# Patient Record
Sex: Male | Born: 1960 | Race: White | Hispanic: No | Smoking: Former smoker
Health system: Southern US, Community
[De-identification: ages and names within clinical notes are randomized; demographics above are authoritative.]

## PROBLEM LIST (undated history)

## (undated) DIAGNOSIS — I10 Essential (primary) hypertension: Secondary | ICD-10-CM

## (undated) DIAGNOSIS — K219 Gastro-esophageal reflux disease without esophagitis: Secondary | ICD-10-CM

---

## 2014-07-25 ENCOUNTER — Encounter (HOSPITAL_COMMUNITY): Payer: Self-pay | Admitting: Family Medicine

## 2014-07-25 ENCOUNTER — Emergency Department (HOSPITAL_COMMUNITY): Payer: Self-pay

## 2014-07-25 ENCOUNTER — Emergency Department (HOSPITAL_COMMUNITY)
Admission: EM | Admit: 2014-07-25 | Discharge: 2014-07-25 | Disposition: A | Discharge status: Home / Self Care | Payer: Self-pay | Attending: Emergency Medicine | Admitting: Emergency Medicine

## 2014-07-25 DIAGNOSIS — Z87891 Personal history of nicotine dependence: Secondary | ICD-10-CM | POA: Insufficient documentation

## 2014-07-25 DIAGNOSIS — Z7982 Long term (current) use of aspirin: Secondary | ICD-10-CM | POA: Insufficient documentation

## 2014-07-25 DIAGNOSIS — Z79899 Other long term (current) drug therapy: Secondary | ICD-10-CM | POA: Insufficient documentation

## 2014-07-25 DIAGNOSIS — M542 Cervicalgia: Secondary | ICD-10-CM | POA: Insufficient documentation

## 2014-07-25 DIAGNOSIS — R0600 Dyspnea, unspecified: Secondary | ICD-10-CM | POA: Insufficient documentation

## 2014-07-25 DIAGNOSIS — I10 Essential (primary) hypertension: Secondary | ICD-10-CM | POA: Insufficient documentation

## 2014-07-25 DIAGNOSIS — R0789 Other chest pain: Secondary | ICD-10-CM | POA: Insufficient documentation

## 2014-07-25 DIAGNOSIS — K219 Gastro-esophageal reflux disease without esophagitis: Secondary | ICD-10-CM | POA: Insufficient documentation

## 2014-07-25 DIAGNOSIS — F419 Anxiety disorder, unspecified: Secondary | ICD-10-CM | POA: Insufficient documentation

## 2014-07-25 HISTORY — DX: Gastro-esophageal reflux disease without esophagitis: K21.9

## 2014-07-25 HISTORY — DX: Essential (primary) hypertension: I10

## 2014-07-25 LAB — COMPREHENSIVE METABOLIC PANEL
ALBUMIN: 4.2 g/dL (ref 3.5–5.2)
ALT: 21 U/L (ref 0–53)
ANION GAP: 8 (ref 5–15)
AST: 28 U/L (ref 0–37)
Alkaline Phosphatase: 64 U/L (ref 39–117)
BUN: 12 mg/dL (ref 6–23)
CHLORIDE: 107 mmol/L (ref 96–112)
CO2: 23 mmol/L (ref 19–32)
Calcium: 9.3 mg/dL (ref 8.4–10.5)
Creatinine, Ser: 0.86 mg/dL (ref 0.50–1.35)
GFR calc Af Amer: >90 mL/min (ref >90)
GFR calc non Af Amer: >90 mL/min (ref >90)
GLUCOSE: 113 mg/dL — AB (ref 70–99)
Potassium: 4.3 mmol/L (ref 3.5–5.1)
Sodium: 138 mmol/L (ref 135–145)
Total Bilirubin: 0.8 mg/dL (ref 0.3–1.2)
Total Protein: 6.3 g/dL (ref 6.0–8.3)

## 2014-07-25 LAB — CBC
HCT: 42 % (ref 39.0–52.0)
HEMOGLOBIN: 14.2 g/dL (ref 13.0–17.0)
MCH: 27.7 pg (ref 26.0–34.0)
MCHC: 33.8 g/dL (ref 30.0–36.0)
MCV: 81.9 fL (ref 78.0–100.0)
Platelets: 182 10*3/uL (ref 150–400)
RBC: 5.13 MIL/uL (ref 4.22–5.81)
RDW: 13.1 % (ref 11.5–15.5)
WBC: 5.6 10*3/uL (ref 4.0–10.5)

## 2014-07-25 LAB — TROPONIN I: Troponin I: <0.03 ng/mL (ref <0.031)

## 2014-07-25 LAB — D-DIMER, QUANTITATIVE: D-Dimer, Quant: <0.27 ug/mL-FEU (ref 0.00–0.48)

## 2014-07-25 MED ORDER — TRAMADOL HCL 50 MG PO TABS: 50.0000 mg | ORAL_TABLET | Freq: Four times a day (QID) | ORAL | Status: AC | PRN

## 2014-07-25 MED ORDER — SODIUM CHLORIDE 0.9 % IV BOLUS (SEPSIS)
1000.0000 mL | Freq: Once | INTRAVENOUS | Status: AC
  Administered 2014-07-25: 1000 mL via INTRAVENOUS

## 2014-07-25 MED ORDER — IBUPROFEN 800 MG PO TABS
800.0000 mg | ORAL_TABLET | Freq: Three times a day (TID) | ORAL | Status: AC
Start: 2014-07-25 — End: 2014-07-29

## 2014-07-25 MED ORDER — HYDROMORPHONE HCL 1 MG/ML IJ SOLN
1.0000 mg | Freq: Once | INTRAMUSCULAR | Status: AC
  Administered 2014-07-25: 1 mg via INTRAVENOUS
  Filled 2014-07-25: qty 1

## 2014-07-25 NOTE — ED Notes (Signed)
Pt ambulatory to the bathroom at this time stated "I really have to go"

## 2014-07-25 NOTE — ED Provider Notes (Signed)
CSN: [id number]     Arrival date & time 07/25/14  4098 History   First MD Initiated Contact with Patient 07/25/14 [phone]     Chief Complaint  Patient presents with  . Chest Pain  . Shortness of Breath     (Consider location/radiation/quality/duration/timing/severity/associated sxs/prior Treatment) HPI patient presents with worsening right-sided chest pain and dyspnea.  Symptoms began about 3 weeks ago, have progressed. No clear precipitant. Since onset no clear alleviating or exacerbating factors, and the patient denies pleuritic pain. Patient was in his usual state of health prior to the onset of pain. Pain is right sided, sore, nonradiating. There is mild associated posterior neck pain on the left. No syncope, nausea, vomiting, fever, chills, cough.   Past Medical History  Diagnosis Date  . Hypertension   . GERD (gastroesophageal reflux disease)    History reviewed. No pertinent past surgical history. No family history on file. History  Substance Use Topics  . Smoking status: Former Games developer  . Smokeless tobacco: Not on file  . Alcohol Use: Not on file    Review of Systems  Constitutional:       Per HPI, otherwise negative  HENT:       Per HPI, otherwise negative  Respiratory:       Per HPI, otherwise negative  Cardiovascular:       Per HPI, otherwise negative  Gastrointestinal: Negative for vomiting.  Endocrine:       Negative aside from HPI  Genitourinary:       Neg aside from HPI   Musculoskeletal:       Per HPI, otherwise negative  Skin: Negative.   Neurological: Negative for syncope.      Allergies  Review of patient's allergies indicates no known allergies.  Home Medications   Prior to Admission medications   Medication Sig Start Date End Date Taking? Authorizing Provider  AMLODIPINE BESYLATE PO Take 1 tablet by mouth daily.   Yes Historical Provider, MD  aspirin EC 81 MG tablet Take 81 mg by mouth daily.   Yes Historical Provider, MD  ibuprofen  (ADVIL,MOTRIN) 800 MG tablet Take 800 mg by mouth every 8 (eight) hours as needed.   Yes Historical Provider, MD  LISINOPRIL PO Take 1 tablet by mouth daily.   Yes Historical Provider, MD  omeprazole (PRILOSEC) 20 MG capsule Take 20 mg by mouth daily.   Yes Historical Provider, MD   BP 134/78 mmHg  Pulse 65  Temp(Src) 97.7 F (36.5 C) (Oral)  Resp 18  SpO2 100% Physical Exam  Constitutional: He is oriented to person, place, and time. He appears well-developed. No distress.  HENT:  Head: Normocephalic and atraumatic.  Eyes: Conjunctivae and EOM are normal.  Cardiovascular: Normal rate and regular rhythm.   Pulmonary/Chest: Effort normal. No stridor. No respiratory distress.  Abdominal: He exhibits no distension.  Musculoskeletal: He exhibits no edema.  Neurological: He is alert and oriented to person, place, and time.  Skin: Skin is warm and dry.  Psychiatric: His mood appears anxious.  Nursing note and vitals reviewed.   ED Course  Procedures (including critical care time) Labs Review Labs Reviewed  COMPREHENSIVE METABOLIC PANEL - Abnormal; Notable for the following:    Glucose, Bld 113 (*)    All other components within normal limits  M. TUBERCULOSIS COMPLEX BY PCR  CBC  TROPONIN I  D-DIMER, QUANTITATIVE    Imaging Review Dg Chest 2 View  07/25/2014   CLINICAL DATA:  Chest pain for 3 weeks  EXAM: CHEST  2 VIEW  COMPARISON:  None.  FINDINGS: Cardiomediastinal silhouette is unremarkable. Tiny calcified granuloma is noted in right upper lobe. No acute infiltrate or pleural effusion. No pulmonary edema. Bony thorax is unremarkable.  IMPRESSION: No active cardiopulmonary disease.   Electronically Signed   By: Natasha MeadLiviu  Pop M.D.   On: 07/25/2014 11:15   I reviewed the x-ray myself, agree with the interpretation.   EKG Interpretation   Date/Time:  Tuesday July 25 2014 09:09:04 EST Ventricular Rate:  63 PR Interval:  132 QRS Duration: 88 QT Interval:  379 QTC Calculation:  388 R Axis:   69 Text Interpretation:  Sinus rhythm Sinus rhythm Artifact Early  repolarization pattern though with substantial artefact Abnormal ekg  Confirmed by Gerhard MunchLOCKWOOD, Faisal Stradling  MD ([id number]) on 07/25/2014 9:43:02 AM     Cardiac 60 sinus rhythm normal Pulse ox 97% room air normal  11:53 AM Patient in no distress.  I discussed all findings with him. He now states that he left jail only 2 weeks ago.  Given the patient's risk factor of incarceration, the small granuloma visualized on x-ray, PCR TB test will be sent. Positive test result will be conveyed to him by our flow manager.  MDM  Patient presents with ongoing chest pain, mild dyspnea. Patient is awake, alert, in no distress, but continues to complain of mild ongoing right-sided chest pain. Given the duration of pain, the unremarkable EKG, negative troponin are reassuring for the low suspicion of ongoing ACS. Patient has negative d-dimer, vital signs and consistent with PE. Patient's pain is likely pleuritic, and he will be started on anti-inflammatories. Given his risk factor for TB, PCR test was sent, though the patient has no clinical demonstration of active infection.   Gerhard Munchobert Patryck Kilgore, MD 07/25/14 1155

## 2014-07-25 NOTE — Discharge Instructions (Signed)
As discussed, although today's evaluation has been largely reassuring, it is very important to follow-up with primary care physician to ensure appropriate resolution of your symptoms.  In addition, please be aware that one laboratory test is still in the process.  If this test is positive, he will be informed of the appropriate follow-up strategy.  Return here for concerning changes in your condition.  Chest Pain (Nonspecific) It is often hard to give a specific diagnosis for the cause of chest pain. There is always a chance that your pain could be related to something serious, such as a heart attack or a blood clot in the lungs. You need to follow up with your health care provider for further evaluation. CAUSES   Heartburn.  Pneumonia or bronchitis.  Anxiety or stress.  Inflammation around your heart (pericarditis) or lung (pleuritis or pleurisy).  A blood clot in the lung.  A collapsed lung (pneumothorax). It can develop suddenly on its own (spontaneous pneumothorax) or from trauma to the chest.  Shingles infection (herpes zoster virus). The chest wall is composed of bones, muscles, and cartilage. Any of these can be the source of the pain.  The bones can be bruised by injury.  The muscles or cartilage can be strained by coughing or overwork.  The cartilage can be affected by inflammation and become sore (costochondritis). DIAGNOSIS  Lab tests or other studies may be needed to find the cause of your pain. Your health care provider may have you take a test called an ambulatory electrocardiogram (ECG). An ECG records your heartbeat patterns over a 24-hour period. You may also have other tests, such as:  Transthoracic echocardiogram (TTE). During echocardiography, sound waves are used to evaluate how blood flows through your heart.  Transesophageal echocardiogram (TEE).  Cardiac monitoring. This allows your health care provider to monitor your heart rate and rhythm in real  time.  Holter monitor. This is a portable device that records your heartbeat and can help diagnose heart arrhythmias. It allows your health care provider to track your heart activity for several days, if needed.  Stress tests by exercise or by giving medicine that makes the heart beat faster. TREATMENT   Treatment depends on what may be causing your chest pain. Treatment may include:  Acid blockers for heartburn.  Anti-inflammatory medicine.  Pain medicine for inflammatory conditions.  Antibiotics if an infection is present.  You may be advised to change lifestyle habits. This includes stopping smoking and avoiding alcohol, caffeine, and chocolate.  You may be advised to keep your head raised (elevated) when sleeping. This reduces the chance of acid going backward from your stomach into your esophagus. Most of the time, nonspecific chest pain will improve within 2-3 days with rest and mild pain medicine.  HOME CARE INSTRUCTIONS   If antibiotics were prescribed, take them as directed. Finish them even if you start to feel better.  For the next few days, avoid physical activities that bring on chest pain. Continue physical activities as directed.  Do not use any tobacco products, including cigarettes, chewing tobacco, or electronic cigarettes.  Avoid drinking alcohol.  Only take medicine as directed by your health care provider.  Follow your health care provider's suggestions for further testing if your chest pain does not go away.  Keep any follow-up appointments you made. If you do not go to an appointment, you could develop lasting (chronic) problems with pain. If there is any problem keeping an appointment, call to reschedule. SEEK MEDICAL CARE IF:  Your chest pain does not go away, even after treatment.  You have a rash with blisters on your chest.  You have a fever. SEEK IMMEDIATE MEDICAL CARE IF:   You have increased chest pain or pain that spreads to your arm,  neck, jaw, back, or abdomen.  You have shortness of breath.  You have an increasing cough, or you cough up blood.  You have severe back or abdominal pain.  You feel nauseous or vomit.  You have severe weakness.  You faint.  You have chills. This is an emergency. Do not wait to see if the pain will go away. Get medical help at once. Call your local emergency services (911 in [place] Do not drive yourself to the hospital. MAKE SURE YOU:   Understand these instructions.  Will watch your condition.  Will get help right away if you are not doing well or get worse. Document Released: 02/26/2005 Document Revised: 05/24/2013 Document Reviewed: 12/23/2007 [facility] Patient Information 2015 [place], [place]. This information is not intended to replace advice given to you by your health care provider. Make sure you discuss any questions you have with your health care provider.

## 2014-07-25 NOTE — ED Notes (Signed)
Dr.Lockwood at bedside  

## 2014-07-25 NOTE — ED Notes (Signed)
Pt presents via POV with c/o right upper burning chest pain and shortness of breath x3-4 weeks. Pt reports worsening over the last few days.

## 2014-07-25 NOTE — ED Notes (Signed)
Pt brought back to room via wheelchair; pt undressed, in gown, on monitor, continuous pulse oximetry and blood pressure cuff 

## 2014-07-28 LAB — QUANTIFERON IN TUBE
QFT TB AG MINUS NIL VALUE: 0.19 IU/mL
QUANTIFERON MITOGEN VALUE: 0.98 IU/mL
QUANTIFERON TB AG VALUE: 0.21 IU/mL
QUANTIFERON TB GOLD: NEGATIVE
Quantiferon Nil Value: 0.02 IU/mL

## 2014-07-28 LAB — QUANTIFERON TB GOLD ASSAY (BLOOD)

## 2015-06-23 IMAGING — CR DG CHEST 2V
2 series · 2 of 2 positions shown · non-contrast
Comparison: None.

CLINICAL DATA: Chest pain for 3 weeks

EXAM:
CHEST  2 VIEW

[chest pa]
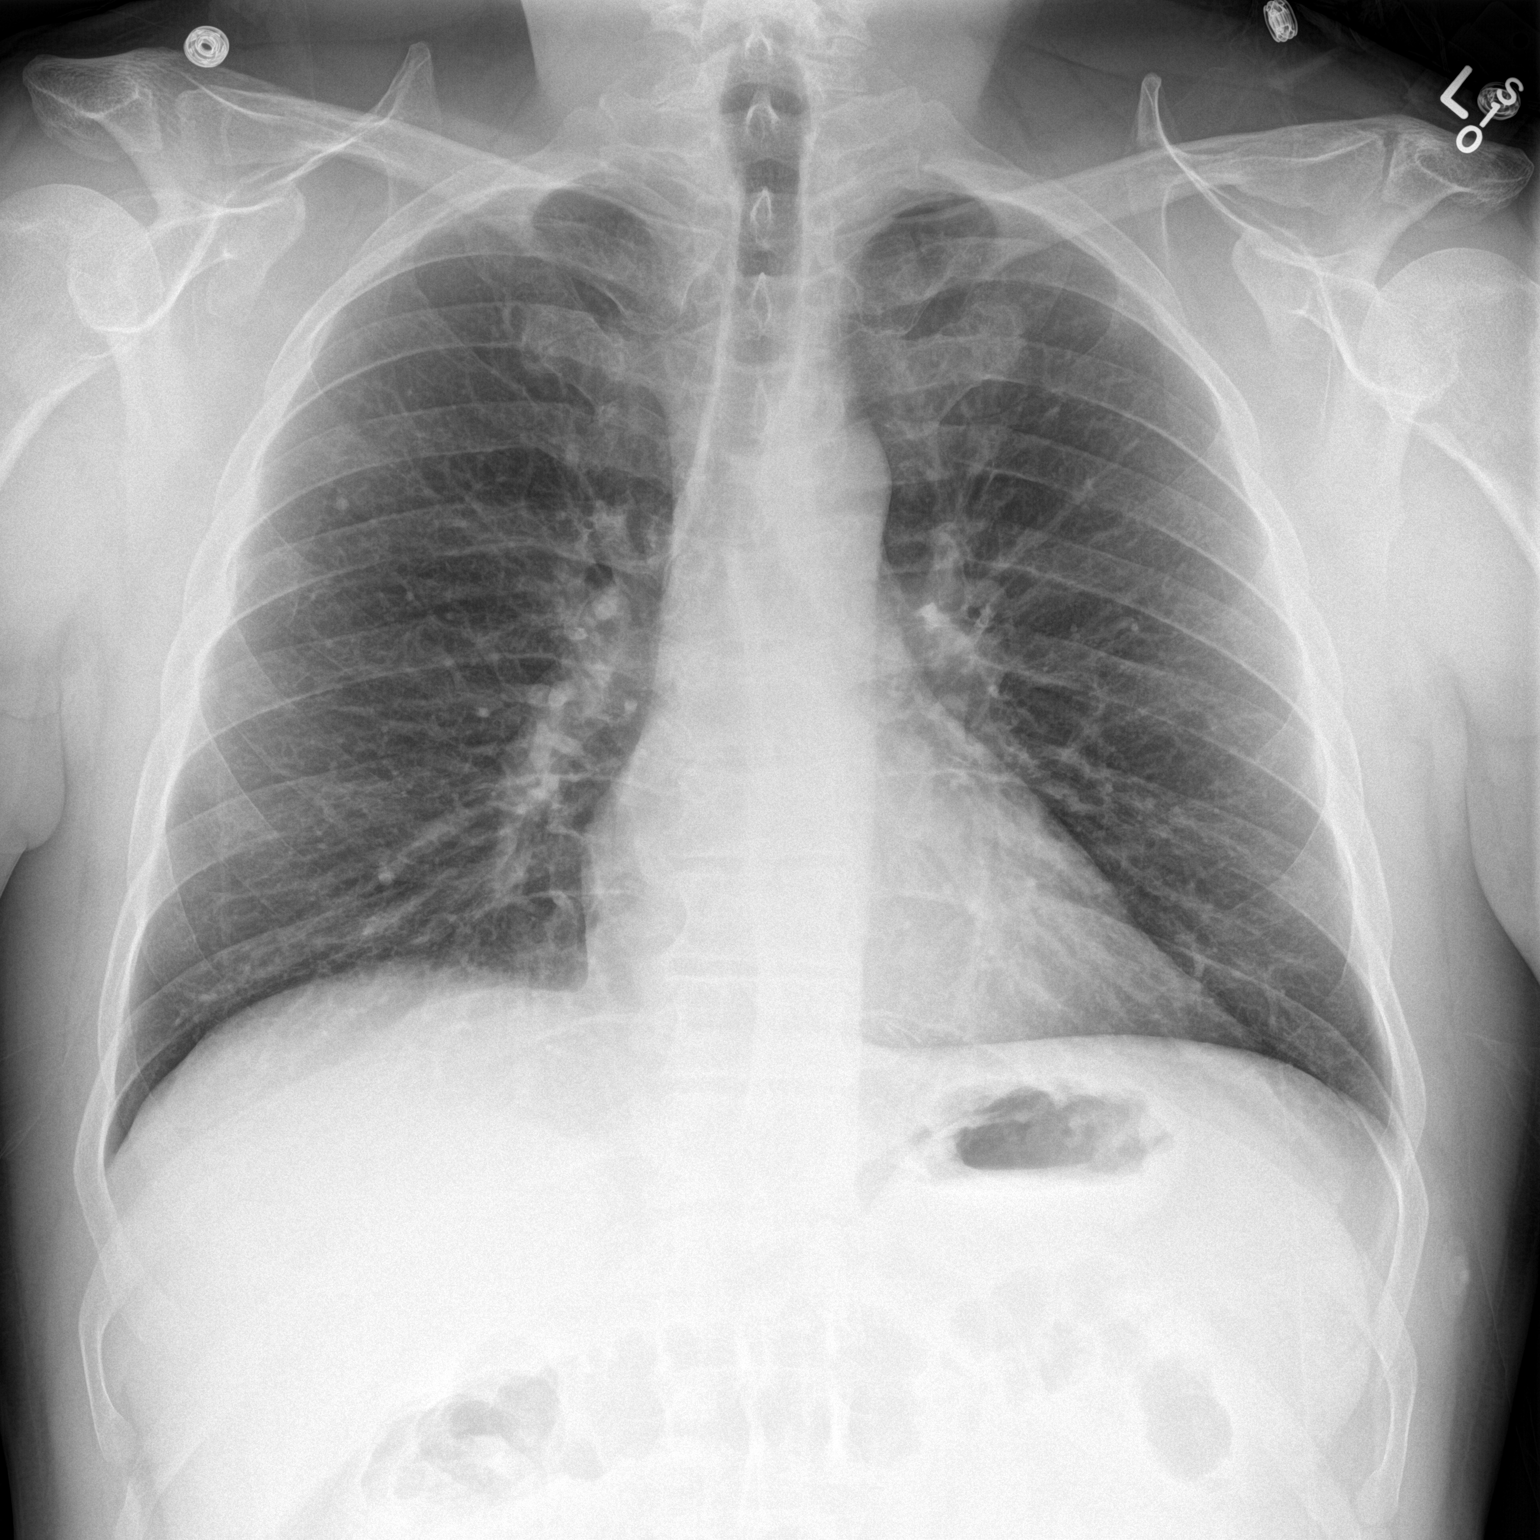

[chest lat]
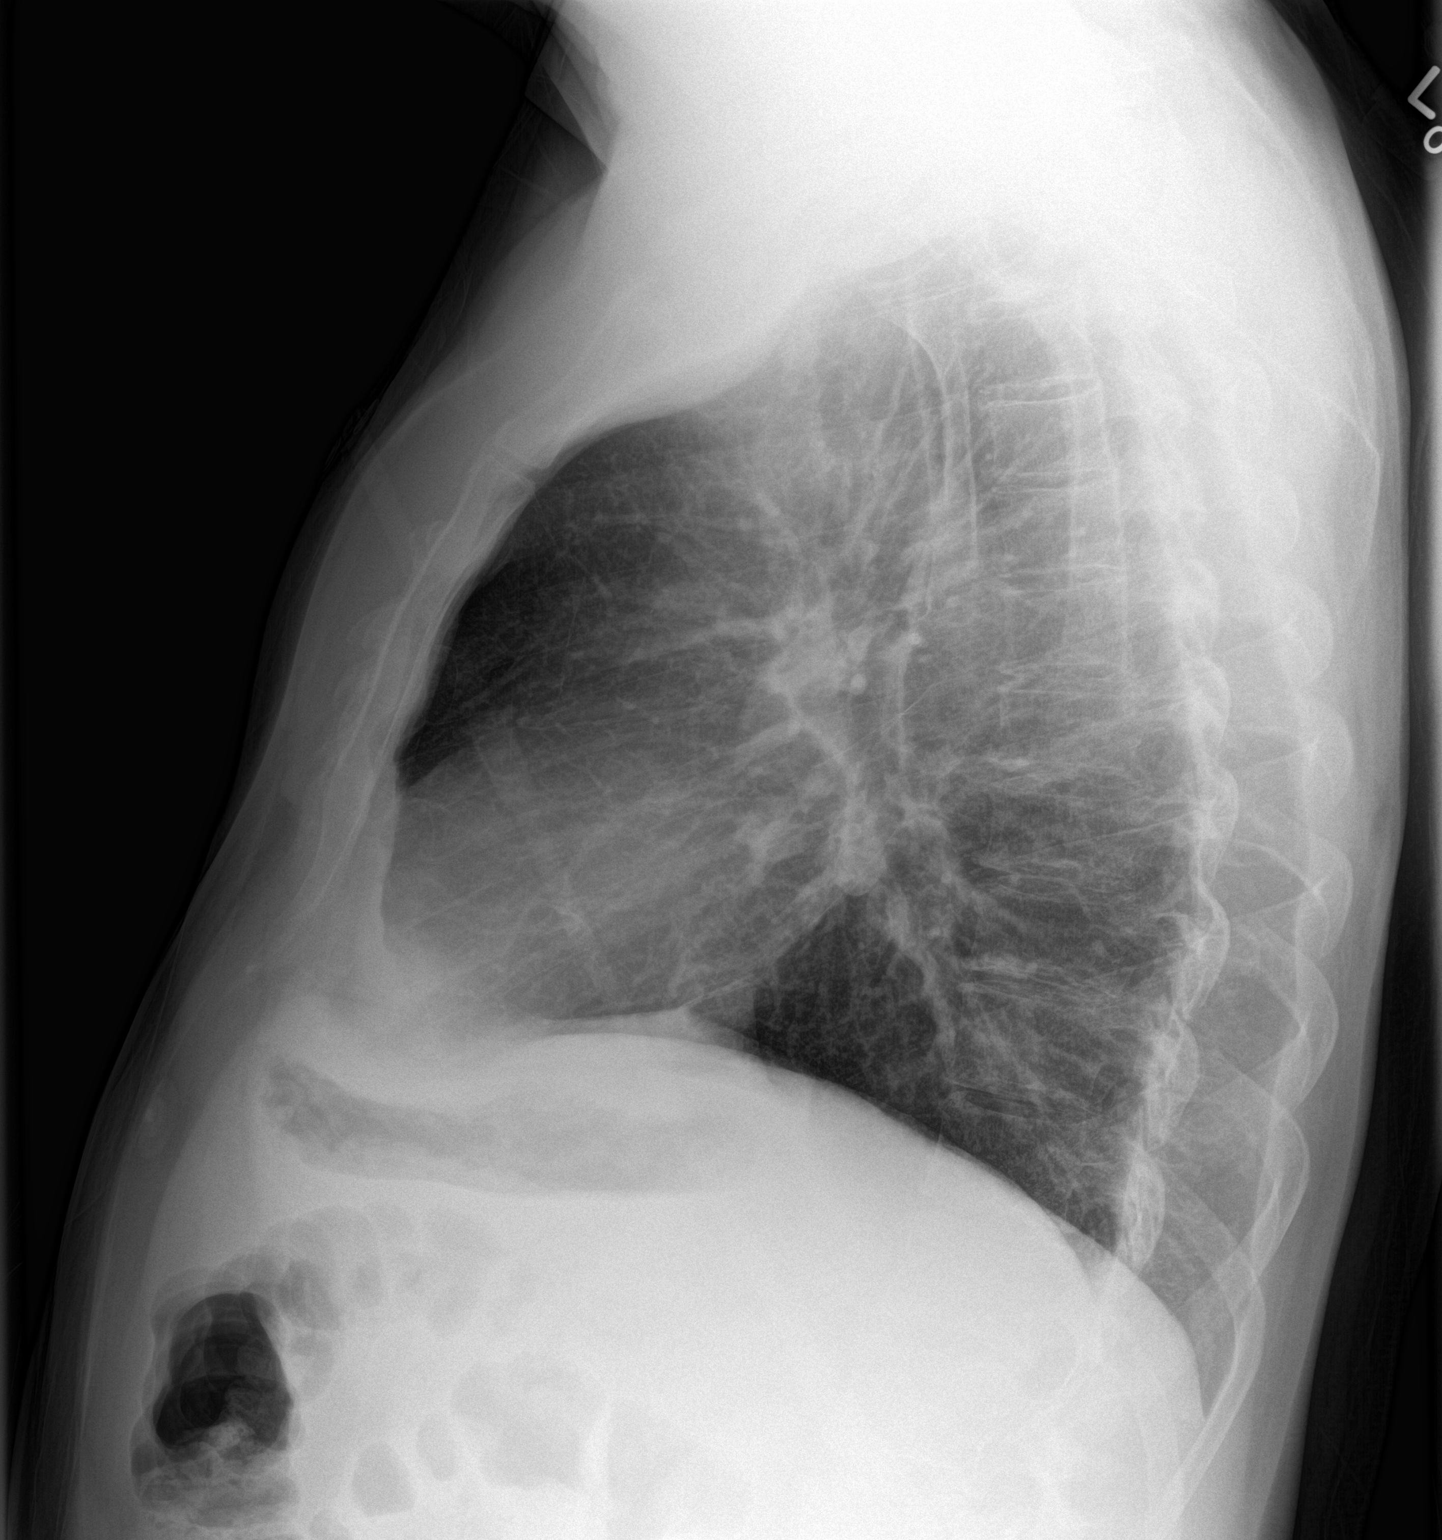

[2 of 2 positions shown; findings below may reference images not displayed]

FINDINGS: Cardiomediastinal silhouette is unremarkable. Tiny calcified
granuloma is noted in right upper lobe. No acute infiltrate or
pleural effusion. No pulmonary edema. Bony thorax is unremarkable.
IMPRESSION: No active cardiopulmonary disease.
